# Patient Record
Sex: Female | Born: 2000 | Race: White | Hispanic: No | Marital: Single | Smoking: Never smoker
Health system: Southern US, Community
[De-identification: ages and names within clinical notes are randomized; demographics above are authoritative.]

## PROBLEM LIST (undated history)

## (undated) DIAGNOSIS — F419 Anxiety disorder, unspecified: Secondary | ICD-10-CM

## (undated) DIAGNOSIS — C44519 Basal cell carcinoma of skin of other part of trunk: Secondary | ICD-10-CM

## (undated) DIAGNOSIS — R1110 Vomiting, unspecified: Secondary | ICD-10-CM

## (undated) DIAGNOSIS — R636 Underweight: Secondary | ICD-10-CM

## (undated) HISTORY — DX: Underweight: R63.6

## (undated) HISTORY — DX: Basal cell carcinoma of skin of other part of trunk: C44.519

## (undated) HISTORY — DX: Anxiety disorder, unspecified: F41.9

---
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
## 2022-06-20 ENCOUNTER — Other Ambulatory Visit (HOSPITAL_COMMUNITY): Payer: Self-pay
